# Patient Record
Sex: Female | Born: 1940 | Race: White | Hispanic: No | Smoking: Never smoker
Health system: Southern US, Community
[De-identification: ages and names within clinical notes are randomized; demographics above are authoritative.]

## PROBLEM LIST (undated history)

## (undated) DIAGNOSIS — H02889 Meibomian gland dysfunction of unspecified eye, unspecified eyelid: Secondary | ICD-10-CM

## (undated) DIAGNOSIS — N8181 Perineocele: Secondary | ICD-10-CM

## (undated) DIAGNOSIS — N816 Rectocele: Secondary | ICD-10-CM

## (undated) DIAGNOSIS — M1990 Unspecified osteoarthritis, unspecified site: Secondary | ICD-10-CM

## (undated) DIAGNOSIS — M810 Age-related osteoporosis without current pathological fracture: Secondary | ICD-10-CM

## (undated) DIAGNOSIS — N8110 Cystocele, unspecified: Secondary | ICD-10-CM

## (undated) HISTORY — DX: Rectocele: N81.6

## (undated) HISTORY — PX: COLONOSCOPY: SHX174

## (undated) HISTORY — DX: Cystocele, unspecified: N81.10

## (undated) HISTORY — DX: Meibomian gland dysfunction of unspecified eye, unspecified eyelid: H02.889

## (undated) HISTORY — DX: Perineocele: N81.81

## (undated) HISTORY — DX: Unspecified osteoarthritis, unspecified site: M19.90

## (undated) HISTORY — PX: ABDOMINAL HYSTERECTOMY: SHX81

## (undated) HISTORY — DX: Age-related osteoporosis without current pathological fracture: M81.0

---

## 1983-11-05 HISTORY — PX: VAGINAL HYSTERECTOMY: SUR661

## 2012-11-04 HISTORY — PX: POSTERIOR REPAIR: SHX2254

## 2013-11-04 DIAGNOSIS — N895 Stricture and atresia of vagina: Secondary | ICD-10-CM

## 2013-11-04 HISTORY — DX: Stricture and atresia of vagina: N89.5

## 2013-11-04 HISTORY — PX: OTHER SURGICAL HISTORY: SHX169

## 2019-11-05 HISTORY — PX: ESOPHAGOGASTRODUODENOSCOPY: SHX1529

## 2020-10-19 ENCOUNTER — Telehealth: Payer: Self-pay | Admitting: Internal Medicine

## 2020-10-19 NOTE — Telephone Encounter (Signed)
Hey Dr Leone Payor, this pt is being referred to [place] per the pt, seeking a new GI, for abdominal pain, pt last saw [facility] on 09/2020, I will send the records upstairs for you to review, please advise on scheduling.

## 2020-10-23 NOTE — Telephone Encounter (Signed)
Left msg on pt's vm to return call and schedule an appt with an APP

## 2020-10-23 NOTE — Telephone Encounter (Signed)
I have reviewed the chart and agree to see her.  Explain to her that it may be more efficient for her to come in and see one of our advanced practice providers and be assigned to me and I will review the note otherwise I think it may be February before she will be able to see me.

## 2020-11-15 ENCOUNTER — Encounter: Payer: Self-pay | Admitting: Physician Assistant

## 2020-11-15 ENCOUNTER — Ambulatory Visit (INDEPENDENT_AMBULATORY_CARE_PROVIDER_SITE_OTHER): Payer: Medicare PPO | Admitting: Physician Assistant

## 2020-11-15 ENCOUNTER — Other Ambulatory Visit (INDEPENDENT_AMBULATORY_CARE_PROVIDER_SITE_OTHER): Payer: Medicare PPO

## 2020-11-15 VITALS — BP 126/80 | HR 76 | Ht 66.0 in | Wt 124.0 lb

## 2020-11-15 DIAGNOSIS — R197 Diarrhea, unspecified: Secondary | ICD-10-CM

## 2020-11-15 DIAGNOSIS — R634 Abnormal weight loss: Secondary | ICD-10-CM

## 2020-11-15 DIAGNOSIS — R935 Abnormal findings on diagnostic imaging of other abdominal regions, including retroperitoneum: Secondary | ICD-10-CM | POA: Diagnosis not present

## 2020-11-15 LAB — COMPREHENSIVE METABOLIC PANEL
ALT: 18 U/L (ref 0–35)
AST: 35 U/L (ref 0–37)
Albumin: 4.6 g/dL (ref 3.5–5.2)
Alkaline Phosphatase: 61 U/L (ref 39–117)
BUN: 10 mg/dL (ref 6–23)
CO2: 30 mEq/L (ref 19–32)
Calcium: 9.5 mg/dL (ref 8.4–10.5)
Chloride: 99 mEq/L (ref 96–112)
Creatinine, Ser: 0.72 mg/dL (ref 0.40–1.20)
GFR: 79.74 mL/min (ref >60.00)
Glucose, Bld: 102 mg/dL — ABNORMAL HIGH (ref 70–99)
Potassium: 4.1 mEq/L (ref 3.5–5.1)
Sodium: 135 mEq/L (ref 135–145)
Total Bilirubin: 0.6 mg/dL (ref 0.2–1.2)
Total Protein: 7.1 g/dL (ref 6.0–8.3)

## 2020-11-15 LAB — CBC WITH DIFFERENTIAL/PLATELET
Basophils Absolute: 0.1 10*3/uL (ref 0.0–0.1)
Basophils Relative: 1.2 % (ref 0.0–3.0)
Eosinophils Absolute: 0.1 10*3/uL (ref 0.0–0.7)
Eosinophils Relative: 1.6 % (ref 0.0–5.0)
HCT: 40 % (ref 36.0–46.0)
Hemoglobin: 13.7 g/dL (ref 12.0–15.0)
Lymphocytes Relative: 26.9 % (ref 12.0–46.0)
Lymphs Abs: 2.1 10*3/uL (ref 0.7–4.0)
MCHC: 34.3 g/dL (ref 30.0–36.0)
MCV: 95.6 fl (ref 78.0–100.0)
Monocytes Absolute: 0.7 10*3/uL (ref 0.1–1.0)
Monocytes Relative: 9.3 % (ref 3.0–12.0)
Neutro Abs: 4.7 10*3/uL (ref 1.4–7.7)
Neutrophils Relative %: 61 % (ref 43.0–77.0)
Platelets: 371 10*3/uL (ref 150.0–400.0)
RBC: 4.19 Mil/uL (ref 3.87–5.11)
RDW: 12.6 % (ref 11.5–15.5)
WBC: 7.7 10*3/uL (ref 4.0–10.5)

## 2020-11-15 NOTE — Progress Notes (Addendum)
Chief Complaint: Urgent diarrhea, weight loss  HPI:    Tina Sparks is a 80 year old Caucasian female with a past medical history as listed below, who presents to clinic today for second opinion of her urgent diarrhea and weight loss.    06/14/2020 CT of the abdomen pelvis with contrast which showed dilation of the common bile duct and pancreatic duct.  Common bile duct was dilated within the head of the pancreas measuring 9.2 mm and the pancreatic duct dilated in the head of the pancreas measuring 4 mm.  Gallbladder was relatively unremarkable.  Also a finding of concern in the bowel with thickening involving the cecum.  Likely left ovarian cyst.    07/12/2020 colonoscopy report shows a few nonbleeding diverticula with a small openings in the left side of the colon, of mild severity and otherwise normal.    08/03/2020 patient saw PCP and at that time discussed abdominal cramping, weight loss and loose stools for several months.    08/10/2020 pelvic ultrasound for left ovarian cyst.  Patient was status post hysterectomy, right ovary was not visualized status post removal and left ovary measured 2.5 x 1.3 x 1.6 cm, there was a cyst.    09/13/2020 EGD at [facility] with "normal esophagus, normal stomach and normal duodenum"    10/11/20 patient seen by PCP and at that time they discussed abdominal pain.  It was noted she had a CT scan which showed abnormalities of the common duct as above.  Also thickening of the second portion of the duodenum.    Today, the patient presents to clinic and tells me that about 6 months ago she started with terrible abdominal cramping and urgency with loose watery stools that looked like "black vomit", these were daily, 2-3 times a day.  She initially went to see a gastroenterologist as above and had colonoscopy as well as stool studies which she tells me she were told were all normal.  They then did a CT scan and EGD and again everything was "normal".  Patient  tells me she has started to do her own research and feels like she may have celiac disease.  She has tried to alter her diet by decreasing caffeine and a few other things and her bowel habits now will be still very urgent and mostly liquid but have a little bit of solid stool and only occur 3-4 times a week at this point.  She is very frustrated because she wishes she knew what was going on so she could fix it.  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  Associated symptoms include a weight loss of about 15 to 20 pounds over the past 6 months.    Does admit to being very stressed and anxious over the entire situation.    Denies fever, chills, nausea, vomiting, heartburn, reflux, blood in her stool or symptoms that awaken her from sleep.  Past Medical History:  Diagnosis Date  . Arthritis     Current Outpatient Medications  Medication Sig Dispense Refill  . acetaminophen (TYLENOL) 650 MG CR tablet Take 1 tablet by mouth at bedtime.    . Biotin 1000 MCG tablet Take 1 tablet by mouth daily.    . Multiple Vitamin (MULTI-VITAMIN) tablet Take 1 tablet by mouth daily.     No current facility-administered medications for this visit.    Allergies as of 11/15/2020  . (  No Known Allergies)    Family History  Problem Relation Age of Onset  . Stroke Mother   . Heart disease Father   . Stroke Sister   . Liver disease Brother     Social History   Socioeconomic History  . Marital status: Single    Spouse name: Not on file  . Number of children: Not on file  . Years of education: Not on file  . Highest education level: Not on file  Occupational History  . Occupation: Retired  Tobacco Use  . Smoking status: Never Smoker  . Smokeless tobacco: Never Used  Substance and Sexual Activity  . Alcohol use: Not Currently  . Drug use: Never  . Sexual activity: Not Currently  Other  Topics Concern  . Not on file  Social History Narrative  . Not on file   Social Determinants of Health   Financial Resource Strain: Not on file  Food Insecurity: Not on file  Transportation Needs: Not on file  Physical Activity: Not on file  Stress: Not on file  Social Connections: Not on file  Intimate Partner Violence: Not on file    Review of Systems:    Constitutional: No fever or chills Skin: No rash Cardiovascular: No chest pain Respiratory: No SOB Gastrointestinal: See HPI and otherwise negative Genitourinary: No dysuria Neurological: No headache, dizziness or syncope Musculoskeletal: No new muscle or joint pain Hematologic: No bleeding  Psychiatric: No history of depression or anxiety   Physical Exam:  Vital signs: BP 126/80   Pulse 76   Ht 5\' 6"  (1.676 m)   Wt 124 lb (56.2 kg)   BMI 20.01 kg/m   Constitutional:   Pleasant Elderly Caucasian female appears to be in NAD, Well developed, Well nourished, alert and cooperative Head:  Normocephalic and atraumatic. Eyes:   PEERL, EOMI. No icterus. Conjunctiva pink. Ears:  Normal auditory acuity. Neck:  Supple Throat: Oral cavity and pharynx without inflammation, swelling or lesion.  Respiratory: Respirations even and unlabored. Lungs clear to auscultation bilaterally.   No wheezes, crackles, or rhonchi.  Cardiovascular: Normal S1, S2. No MRG. Regular rate and rhythm. No peripheral edema, cyanosis or pallor.  Gastrointestinal:  Soft, nondistended, nontender. No rebound or guarding. Normal bowel sounds. No appreciable masses or hepatomegaly. Rectal:  Not performed.  Msk:  Symmetrical without gross deformities. Without edema, no deformity or joint abnormality.  Neurologic:  Alert and  oriented x4;  grossly normal neurologically.  Skin:   Dry and intact without significant lesions or rashes. Psychiatric: Demonstrates good judgement and reason without abnormal affect or behaviors.  See HPI  Assessment: 1.  Urgent  diarrhea: Over the past 6 months has gotten slightly better over the past month or so, initially with weight loss of about 15 pounds in the past 6 months, previous work-up with EGD, colonoscopy, CT and apparently stool studies all negative/normal (we are requesting these today); consider celiac disease versus more likely IBS versus other 2.  Abnormal CT of the abdomen: Showing dilated bile ducts 3.  Weight loss: 15 pounds in the past 6 months  Plan: 1. Ordered celiac testing, CBC and CMP 2.  Discussed patient's history with her in depth.  She is very frustrated, apparently had no knowledge of her abnormal CT. 3.  We only received partial records today, will contact her previous GI physician as well as PCP for recent labs and pathology results as well as office visits. 4.  Ordered an MRCP with and without contrast today for  further evaluation of dilated bile duct seen at time of CT. 5.  Told the patient to continue what dieting she is doing currently as this seems to be helping slightly. 6.  Briefly discussed the possibility of IBS given patient's stress. 7.  Patient to follow in clinic per recommendations after testing above.  She wanted to be assigned to Dr. Leone Payor today.  Hyacinth Meeker, PA-C [facility] 11/15/2020, 3:42 PM   Addendum: 11/16/2020 12:44 PM  Received records from [facility]  07/12/2020 colonoscopy with mild diverticulosis in the left colon and otherwise normal.  (It does not indicate that biopsies were taken.  09/13/2020 EGD with normal esophagus, normal stomach and normal duodenum.  (It does not indicate the biopsies were taken)  Reviewed records above, will request actual office visit notes from them.  Interestingly neither procedure report indicates that they did biopsies, so I do not believe they did testing for celiac or microscopic colitis.  Hyacinth Meeker, PA-C  Addendum: 11/29/2020 9:12 AM  Received records from [facility]  [facility].  10/11/2020 patient seen and continued with chronic and unstable abdominal pain.  There is some concern given that CT scan showed abnormalities of the common duct and pancreatic duct with dilation.  There is also thickening of the second portion of the duodenum extending to the ampulla.  She was referred to [place].  No change to current recommendations.  Hyacinth Meeker, PA-C  Addendum: 11/29/2020 11:18 AM  We will see records from [facility] gastroenterology center in regards to office visits.  10/14/2019 patient was having a 51-month follow-up for diarrhea.  She thought it was some stress at that time.  She was told to get a colonoscopy.  06/06/2020 patient seen in clinic for suspicion of IBS.  She described diarrhea several times a day and more gas pain than normal.  She was told to have a colonoscopy and also had stool studies.  06/13/2020 stool studies showed negative C. difficile, Campylobacter, E. coli, Salmonella, ova and parasites.  06/22/2020 patient seen in clinic for follow-up of diarrhea.  At that time she had a colon scheduled for 07/12/2020.  She was placed on dicyclomine 10 mg twice a day and a high-fiber diet.  09/08/2020 patient was seen for follow-up.  At that time described having diarrhea and weight loss.  At that time apparently her main symptom is foul-smelling flatus at times with urgency of defecation, described losing 15 pounds in the last few months.  Is recommend she have an EGD due to abnormality around the duodenum and a CT.  No change to current recommendations.  Hyacinth Meeker, PA-C

## 2020-11-15 NOTE — Patient Instructions (Signed)
If you are age 80 or older, your body mass index should be between 23-30. Your Body mass index is 20.01 kg/m. If this is out of the aforementioned range listed, please consider follow up with your Primary Care Provider.  If you are age 35 or younger, your body mass index should be between 19-25. Your Body mass index is 20.01 kg/m. If this is out of the aformentioned range listed, please consider follow up with your Primary Care Provider.   Your provider has requested that you go to the basement level for lab work before leaving today. Press "B" on the elevator. The lab is located at the first door on the left as you exit the elevator.  You have been scheduled for an MRI at [facility] Radiology 1st floor  on 11/27/20. Your appointment time is 9am. Please arrive at 8:30am  for registration purposes. Please make certain not to have anything to eat or drink 6 hours prior to your test. In addition, if you have any metal in your body, have a pacemaker or defibrillator, please be sure to let your ordering physician know. This test typically takes 45 minutes to 1 hour to complete. Should you need to reschedule, please call [phone] to do so.  Thank you for choosing me and [facility].  Hyacinth Meeker, PA-C

## 2020-11-16 LAB — IGA: Immunoglobulin A: 76 mg/dL (ref 70–320)

## 2020-11-16 LAB — TISSUE TRANSGLUTAMINASE, IGA: (tTG) Ab, IgA: <1 U/mL

## 2020-11-22 ENCOUNTER — Telehealth: Payer: Self-pay | Admitting: Physician Assistant

## 2020-11-22 NOTE — Telephone Encounter (Signed)
Pt needs to r/s MRI that is scheduled for 11/27/20. She will have more availability around mid February.

## 2020-11-23 NOTE — Telephone Encounter (Signed)
Patient is returning your call.  

## 2020-11-23 NOTE — Telephone Encounter (Signed)
Left detailed message letting patient know that she can call central scheduling to reschedule her MRI at her convenience, I provided her with the number. Advised patient to give me a call back if she had any further questions or concerns.

## 2020-11-23 NOTE — Telephone Encounter (Signed)
Lm on vm for patient to return call 

## 2020-11-27 ENCOUNTER — Ambulatory Visit (HOSPITAL_COMMUNITY): Payer: Medicare PPO

## 2020-12-20 ENCOUNTER — Ambulatory Visit (HOSPITAL_COMMUNITY): Payer: Medicare PPO

## 2020-12-26 ENCOUNTER — Ambulatory Visit (HOSPITAL_COMMUNITY): Payer: Medicare PPO

## 2021-01-02 ENCOUNTER — Other Ambulatory Visit: Payer: Self-pay | Admitting: Physician Assistant

## 2021-01-02 ENCOUNTER — Ambulatory Visit (HOSPITAL_COMMUNITY)
Admission: RE | Admit: 2021-01-02 | Discharge: 2021-01-02 | Disposition: A | Discharge status: Home / Self Care | Payer: Medicare PPO | Source: Ambulatory Visit | Attending: Physician Assistant | Admitting: Physician Assistant

## 2021-01-02 ENCOUNTER — Other Ambulatory Visit: Payer: Self-pay

## 2021-01-02 DIAGNOSIS — R935 Abnormal findings on diagnostic imaging of other abdominal regions, including retroperitoneum: Secondary | ICD-10-CM

## 2021-01-02 DIAGNOSIS — R634 Abnormal weight loss: Secondary | ICD-10-CM

## 2021-01-02 DIAGNOSIS — R197 Diarrhea, unspecified: Secondary | ICD-10-CM

## 2021-01-02 MED ORDER — GADOBUTROL 1 MMOL/ML IV SOLN
5.0000 mL | Freq: Once | INTRAVENOUS | Status: AC | PRN
  Administered 2021-01-02: 5 mL via INTRAVENOUS

## 2021-01-04 ENCOUNTER — Other Ambulatory Visit: Payer: Self-pay

## 2021-01-04 ENCOUNTER — Telehealth: Payer: Self-pay | Admitting: Physician Assistant

## 2021-01-04 DIAGNOSIS — R935 Abnormal findings on diagnostic imaging of other abdominal regions, including retroperitoneum: Secondary | ICD-10-CM

## 2021-01-04 DIAGNOSIS — R197 Diarrhea, unspecified: Secondary | ICD-10-CM

## 2021-01-04 DIAGNOSIS — R634 Abnormal weight loss: Secondary | ICD-10-CM

## 2021-01-04 NOTE — Telephone Encounter (Signed)
Unk Lightning, PA  [place], [place], RN Yes, please tell her that I do have documentation of those other stool studies, this is a new 1. This is checking for pancreatic insufficiency in regards to new imaging which we have seen. Thanks.   Patient has been advised of this information, she would like to have stool study completed at a lab closer to her home. Patient is getting the lab information so that the order can be faxed. See telephone encounter 01/04/21 for more information.

## 2021-01-04 NOTE — Telephone Encounter (Signed)
Patient called requesting to speak with a nurse has additional information to provide from previous result note

## 2021-01-04 NOTE — Telephone Encounter (Signed)
Spoke with patient, she states that Dr. Eudelia Bunch office stated that they would fax records to [place] today. Patient would like for me to let her know by 4:15 PM if I have not received the records.

## 2021-01-08 NOTE — Telephone Encounter (Signed)
Spoke with patient she states that she will plan to leave stool sample on 01/30/21 when she will be in town, patient states that if she is able to come in sooner she will let us know. She is aware that she can stop by between 7:30 am - 5 pm. Patient verbalized understanding and had no concerns at the end of the call.

## 2021-01-08 NOTE — Telephone Encounter (Signed)
Pt is requesting a call back from a nurse to schedule her stool study sample.

## 2021-01-08 NOTE — Addendum Note (Signed)
Addended by: Missy Sabins on: 01/08/2021 02:53 PM   Modules accepted: Orders

## 2021-01-16 NOTE — Addendum Note (Signed)
Addended by: Missy Sabins on: 01/16/2021 02:36 PM   Modules accepted: Orders

## 2021-02-05 DIAGNOSIS — R152 Fecal urgency: Secondary | ICD-10-CM

## 2021-02-05 DIAGNOSIS — K838 Other specified diseases of biliary tract: Secondary | ICD-10-CM

## 2021-02-05 DIAGNOSIS — Q453 Other congenital malformations of pancreas and pancreatic duct: Secondary | ICD-10-CM

## 2021-02-05 DIAGNOSIS — R634 Abnormal weight loss: Secondary | ICD-10-CM

## 2021-02-06 ENCOUNTER — Other Ambulatory Visit: Payer: Self-pay | Admitting: Internal Medicine

## 2021-02-09 ENCOUNTER — Encounter: Payer: Self-pay | Admitting: Internal Medicine

## 2021-02-09 DIAGNOSIS — R634 Abnormal weight loss: Secondary | ICD-10-CM | POA: Insufficient documentation

## 2021-02-09 DIAGNOSIS — Q453 Other congenital malformations of pancreas and pancreatic duct: Secondary | ICD-10-CM | POA: Insufficient documentation

## 2021-02-09 DIAGNOSIS — R152 Fecal urgency: Secondary | ICD-10-CM | POA: Insufficient documentation

## 2021-02-09 DIAGNOSIS — K838 Other specified diseases of biliary tract: Secondary | ICD-10-CM | POA: Insufficient documentation

## 2021-02-09 LAB — PANCREATIC ELASTASE, FECAL: Pancreatic Elastase, Fecal: 221 ug Elast./g (ref >200)

## 2021-02-09 NOTE — Telephone Encounter (Signed)
I called the patient.  Her fecal elastase result was faxed to me and was 221 which is above normal of 200.  Her bowel movements are less loose though they still occur that way sometimes.  Weight loss has stabilized.  She has fecal urgency issues.  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  She could not remember that actually.   She has a 10 mm bile duct with intact gallbladder, and a mildly possibly dilated pancreatic duct and a questionable calcification in the head of the pancreas.  I think she is going to need additional imaging with EUS plus or minus ERCP.   She has plans to try Benefiber to see if that helps her fecal urgency.  We will see her next week I have some openings and I had like to examine her in person and come up with the next steps.  I will ask my colleagues to do EUS to review her imaging to see what they think is the next best step though I think EUS makes sense rather than moving to ERCP with only a dilated bile duct and unclear pancreatic abnormalities and normal LFTs.

## 2021-02-12 NOTE — Telephone Encounter (Signed)
Spoke with patient, she has been scheduled for a follow up with Dr. Leone Payor on Wed. 02/14/21 at 2:50 PM. Patient wanted to thank Dr. Leone Payor again for reaching out to her. She had no further concerns at the end of the call.

## 2021-02-14 ENCOUNTER — Ambulatory Visit: Payer: Medicare PPO | Admitting: Internal Medicine

## 2021-02-14 ENCOUNTER — Encounter: Payer: Self-pay | Admitting: Internal Medicine

## 2021-02-14 VITALS — BP 102/64 | HR 64 | Ht 66.0 in | Wt 122.0 lb

## 2021-02-14 DIAGNOSIS — K838 Other specified diseases of biliary tract: Secondary | ICD-10-CM

## 2021-02-14 DIAGNOSIS — Q453 Other congenital malformations of pancreas and pancreatic duct: Secondary | ICD-10-CM

## 2021-02-14 DIAGNOSIS — R152 Fecal urgency: Secondary | ICD-10-CM

## 2021-02-14 NOTE — Progress Notes (Signed)
Tina Sparks 80 y.o. 1941-02-11 [id number]  Assessment & Plan:   Encounter Diagnoses  Name Primary?  . Fecal urgency Yes  . Dilated bile duct   . 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  She had a lot of diarrhea than that improved but she continues with some symptoms.  She can continue her trial of fiber.  I had wondered about the possibility of pelvic floor dysfunction given her previous pelvic surgeries but at least on physical exam today and by history that does not seem to be the case.  I am not sure exactly how to proceed yet regarding the dilated bile duct the abnormalities of the pancreatic duct and the question calcification in the pancreas.  We will obtain the images of her CT scan in [place] from last year so that our radiologist may compare.  Pending that she could need endoscopic evaluation with EUS plus or minus ERCP.  Considering possible repeat colonoscopy pending clinical course with bowel symptoms.  The dicyclomine was reasonable but is not clear is helping her so I told her she can stop it and/or use as needed.  I appreciate the opportunity to care for this patient. CC: Aniceto Boss, MD    Subjective:   Chief Complaint: Fecal urgency, diarrhea weight loss  HPI The patient is a 80 year old woman seen by Tina Meeker, PA-C on November 15, 2020 because of an abnormal CT of the abdomen diarrhea issues etc.  Please refer to that note for full details.  As part of that work-up she had an MRCP MRI and also had fecal elastase which returned normal.   IMPRESSION: 1. Fusiform dilatation of the common bile duct measuring up to 1 cm without evidence for choledocholithiasis. 2. There is mild increase caliber of the main pancreatic duct within the head of pancreas which measures between 3 and 4 mm. Focal area of signal void artifact noted within the head of pancreas in the region of the distal main duct  measuring 0.9 cm, image 47/13. This may represent a calcification. Comparison with previous outside CT would be helpful for further delineation of the etiology for signal void artifact. Additionally, if there are signs or symptoms of biliary obstruction further investigation with ERCP would be recommended to exclude underlying lesion at the ampulla or occult lesion within head of pancreas.  Wt Readings from Last 3 Encounters:  02/14/21 122 lb (55.3 kg)  11/15/20 124 lb (56.2 kg)   She has problems with fecal urgency more than anything now for example she will have a normal bowel movement which is unusual and then she will have to go back and defecate again it will be diarrhea.  But a lot of times will also have soft stools and had this urgency.  No significant incontinence and no urinary incontinence.  A number of years ago she had a posterior repair of cystocele and rectocele and perineocele at [facility].  Those problems are all resolved though she did need a surgery because of vaginal stenosis after that.  She has recently started a trial of Benefiber.  She is on dicyclomine and is not sure that is making a difference. No Known Allergies Current Meds  Medication Sig  . acetaminophen (TYLENOL) 650 MG CR tablet Take 1 tablet by mouth at bedtime.  . Biotin 1000 MCG tablet Take 1 tablet by mouth daily.  Marland Kitchen dicyclomine (BENTYL) 10 MG capsule Take 10 mg  by mouth 2 (two) times daily.  . Multiple Vitamin (MULTI-VITAMIN) tablet Take 1 tablet by mouth daily.   Past Medical History:  Diagnosis Date  . Cystocele with rectocele   . Meibomian gland dysfunction   . Osteoarthritis   . Osteoporosis   . Perineocele   . 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 COLONOSCOPY    . ESOPHAGOGASTRODUODENOSCOPY  2021  . POSTERIOR REPAIR  2014   With sacrouterine fixation  . VAGINAL HYSTERECTOMY  1985  . Vaginal scar revision   2015   Scarring from prior prolapse surgery   Social History   Social History Narrative   Retired Runner, broadcasting/film/video   Single   Never smoker no current alcohol tobacco or drug use   family history includes Heart disease in her father; Liver disease in her brother; Stroke in her mother and sister.   Review of Systems   Objective:   Physical Exam BP 102/64   Pulse 64   Ht 5\' 6"  (1.676 m)   Wt 122 lb (55.3 kg)   SpO2 99%   BMI 19.69 kg/m  Thin NAD abd soft and NT  Tina Sparks CMA present   Anoderm inspection revealed no abnormalities Digital exam revealed fairly normal resting tone and voluntary squeeze.  She did use gluteals quite a bit to squeeze I think the anal sphincter on voluntary effort is somewhat weak. No mass or rectocele present. Simulated defecation with valsalva revealed appropriate abdominal contraction and descent.   Anoscopy is performed and demonstrates no significant hemorrhoids or anal or rectal mucosal change.

## 2021-02-14 NOTE — Patient Instructions (Signed)
Please get all the actual CT and ultrasound disc from 2021 to Dr Leone Payor either by mail or bring them to [place].  Once Dr Leone Payor reviews them we will be in touch with plans.  You may take the dicyclomine as needed.  I appreciate the opportunity to care for you. Stan Head, MD, [facility]

## 2021-04-03 ENCOUNTER — Inpatient Hospital Stay
Admission: RE | Admit: 2021-04-03 | Discharge: 2021-04-03 | Disposition: A | Discharge status: Home / Self Care | Payer: Self-pay | Source: Ambulatory Visit | Attending: Internal Medicine | Admitting: Internal Medicine

## 2021-04-03 ENCOUNTER — Other Ambulatory Visit (HOSPITAL_COMMUNITY): Payer: Self-pay | Admitting: Internal Medicine

## 2021-04-03 DIAGNOSIS — K838 Other specified diseases of biliary tract: Secondary | ICD-10-CM

## 2021-04-12 ENCOUNTER — Other Ambulatory Visit: Payer: Self-pay

## 2021-04-12 DIAGNOSIS — K838 Other specified diseases of biliary tract: Secondary | ICD-10-CM

## 2021-04-24 ENCOUNTER — Telehealth: Payer: Self-pay

## 2021-04-24 DIAGNOSIS — R152 Fecal urgency: Secondary | ICD-10-CM

## 2021-04-24 NOTE — Telephone Encounter (Signed)
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

## 2021-04-24 NOTE — Telephone Encounter (Signed)
Referral placed and they will contact her about an appointment.

## 2021-05-01 NOTE — Telephone Encounter (Signed)
See alternate patient message. 

## 2021-05-02 ENCOUNTER — Telehealth: Payer: Self-pay | Admitting: Physician Assistant

## 2021-05-02 NOTE — Telephone Encounter (Signed)
Telephone call  05/02/2021 2:30 PM  Patient sent a MyChart message requesting a phone call to answer some questions about upcoming procedure as well as physical therapy suggested by Dr. Leone Payor.  He tried to call her earlier this afternoon and I have now called 3 times with no response.  I did leave a message on her machine that we are trying to get in contact with her.  Explained that I will try and call back 1 more time later today and/or tomorrow morning.  Hyacinth Meeker, PA-C

## 2021-05-03 ENCOUNTER — Telehealth: Payer: Self-pay | Admitting: Physician Assistant

## 2021-05-03 NOTE — Progress Notes (Signed)
Attempted to obtain medical history via telephone, unable to reach at this time. I left a voicemail to return pre surgical testing department's phone call.  

## 2021-05-03 NOTE — Telephone Encounter (Signed)
Tina Sparks, patient is scheduled for an EUS with Dr. Christella Hartigan on Thursday, 05/10/21 at 11:30 am at [facility]. This information is under the "Admissions" tab on the appt desk. Thanks

## 2021-05-03 NOTE — Telephone Encounter (Signed)
Telephone Call  05/03/21  9:03  Spoke with patient this morning.  Answered questions about upcoming procedure scheduled with Dr. Meridee Score.  She tells me is scheduled on the 7th of July though I cannot see any appointments in the computer for her.  I am going to double check with our nurse to make sure this is arranged.  Discussed that this is due to dilated common bile duct seen on recent MRI.  Answered all of her questions.  Also discussed rectal/fecal incontinence.  Discussed physical floor therapy and why this was recommended.  Patient tells me she is going to follow through.  She appreciated our phone call.  Tina Meeker, PA-C     [place]- can you make sure patient is scheduled for EUS with Dr. Meridee Score.  Thanks-JLL

## 2021-05-08 NOTE — Progress Notes (Signed)
Attempted to obtain medical history via telephone, unable to reach at this time. I left a voicemail to return pre surgical testing department's phone call.  

## 2021-05-10 ENCOUNTER — Ambulatory Visit (HOSPITAL_COMMUNITY)
Admission: RE | Admit: 2021-05-10 | Discharge: 2021-05-10 | Disposition: A | Discharge status: Home / Self Care | Payer: Medicare PPO | Attending: Gastroenterology | Admitting: Gastroenterology

## 2021-05-10 ENCOUNTER — Encounter (HOSPITAL_COMMUNITY): Payer: Self-pay | Admitting: Gastroenterology

## 2021-05-10 ENCOUNTER — Encounter (HOSPITAL_COMMUNITY)
Admission: RE | Disposition: A | Discharge status: Home / Self Care | Payer: Self-pay | Source: Home / Self Care | Attending: Gastroenterology

## 2021-05-10 ENCOUNTER — Ambulatory Visit (HOSPITAL_COMMUNITY): Payer: Medicare PPO | Admitting: Registered Nurse

## 2021-05-10 ENCOUNTER — Other Ambulatory Visit: Payer: Self-pay

## 2021-05-10 DIAGNOSIS — K838 Other specified diseases of biliary tract: Secondary | ICD-10-CM | POA: Diagnosis present

## 2021-05-10 DIAGNOSIS — K5710 Diverticulosis of small intestine without perforation or abscess without bleeding: Secondary | ICD-10-CM | POA: Diagnosis not present

## 2021-05-10 HISTORY — PX: EUS: SHX5427

## 2021-05-10 HISTORY — PX: ESOPHAGOGASTRODUODENOSCOPY (EGD) WITH PROPOFOL: SHX5813

## 2021-05-10 SURGERY — UPPER ENDOSCOPIC ULTRASOUND (EUS) RADIAL
Anesthesia: Monitor Anesthesia Care

## 2021-05-10 MED ORDER — LACTATED RINGERS IV SOLN: INTRAVENOUS | Status: DC | PRN

## 2021-05-10 MED ORDER — SODIUM CHLORIDE 0.9 % IV SOLN: INTRAVENOUS | Status: DC

## 2021-05-10 MED ORDER — PROPOFOL 500 MG/50ML IV EMUL
INTRAVENOUS | Status: DC | PRN
  Administered 2021-05-10 (×2): 450 ug/kg/min via INTRAVENOUS

## 2021-05-10 MED ORDER — PROPOFOL 10 MG/ML IV BOLUS
INTRAVENOUS | Status: DC | PRN
  Administered 2021-05-10: 100 mg via INTRAVENOUS
  Administered 2021-05-10: 50 mg via INTRAVENOUS
  Administered 2021-05-10: 150 mg via INTRAVENOUS

## 2021-05-10 MED ORDER — PROPOFOL 1000 MG/100ML IV EMUL
INTRAVENOUS | Status: AC
  Filled 2021-05-10: qty 100

## 2021-05-10 MED ORDER — ONDANSETRON HCL 4 MG/2ML IJ SOLN
INTRAMUSCULAR | Status: DC | PRN
  Administered 2021-05-10: 4 mg via INTRAVENOUS

## 2021-05-10 MED ORDER — LACTATED RINGERS IV SOLN: Freq: Once | INTRAVENOUS | Status: AC

## 2021-05-10 NOTE — Anesthesia Preprocedure Evaluation (Addendum)
Anesthesia Evaluation  Patient identified by MRN, date of birth, ID band Patient awake    Reviewed: Allergy & Precautions, NPO status , Patient's Chart, lab work & pertinent test results  History of Anesthesia Complications Negative for: history of anesthetic complications  Airway Mallampati: II  TM Distance: >3 FB Neck ROM: Full    Dental no notable dental hx.    Pulmonary neg pulmonary ROS,    Pulmonary exam normal        Cardiovascular negative cardio ROS Normal cardiovascular exam     Neuro/Psych negative neurological ROS  negative psych ROS   GI/Hepatic Neg liver ROS, Dilated bile duct   Endo/Other  negative endocrine ROS  Renal/GU negative Renal ROS  negative genitourinary   Musculoskeletal  (+) Arthritis ,   Abdominal   Peds  Hematology negative hematology ROS (+)   Anesthesia Other Findings Day of surgery medications reviewed with patient.  Reproductive/Obstetrics negative OB ROS                            Anesthesia Physical Anesthesia Plan  ASA: 2  Anesthesia Plan: MAC   Post-op Pain Management:    Induction:   PONV Risk Score and Plan: Treatment may vary due to age or medical condition and Propofol infusion  Airway Management Planned: Natural Airway and Nasal Cannula  Additional Equipment: None  Intra-op Plan:   Post-operative Plan:   Informed Consent: I have reviewed the patients History and Physical, chart, labs and discussed the procedure including the risks, benefits and alternatives for the proposed anesthesia with the patient or authorized representative who has indicated his/her understanding and acceptance.       Plan Discussed with: CRNA  Anesthesia Plan Comments:        Anesthesia Quick Evaluation

## 2021-05-10 NOTE — H&P (Signed)
  HPI: This is a woman with asymptomatic dilated bile duct and pancreatic duct   ROS: complete GI ROS as described in HPI, all other review negative.  Constitutional:  No unintentional weight loss   Past Medical History:  Diagnosis Date   Cystocele with rectocele    Meibomian gland dysfunction    Osteoarthritis    Osteoporosis    Perineocele    Vaginal stenosis 2015   After posterior repair corrected with surgery    Past Surgical History:  Procedure Laterality Date   COLONOSCOPY     ESOPHAGOGASTRODUODENOSCOPY  2021   POSTERIOR REPAIR  2014   With sacrouterine fixation   VAGINAL HYSTERECTOMY  1985   Vaginal scar revision  2015   Scarring from prior prolapse surgery    Current Facility-Administered Medications  Medication Dose Route Frequency Provider Last Rate Last Admin   0.9 %  sodium chloride infusion   Intravenous Continuous Rachael Fee, MD        Allergies as of 04/12/2021   (No Known Allergies)    Family History  Problem Relation Age of Onset   Stroke Mother    Heart disease Father    Stroke Sister    Liver disease Brother     Social History   Socioeconomic History   Marital status: Divorced    Spouse name: Not on file   Number of children: Not on file   Years of education: Not on file   Highest education level: Not on file  Occupational History   Occupation: Retired  Tobacco Use   Smoking status: Never   Smokeless tobacco: Never  Substance and Sexual Activity   Alcohol use: Not Currently   Drug use: Never   Sexual activity: Not Currently  Other Topics Concern   Not on file  Social History Narrative   Retired Runner, broadcasting/film/video   Single   Never smoker no current alcohol tobacco or drug use   Social Determinants of Corporate investment banker Strain: Not on file  Food Insecurity: Not on file  Transportation Needs: Not on file  Physical Activity: Not on file  Stress: Not on file  Social Connections: Not on file  Intimate Partner Violence:  Not on file     Physical Exam: There were no vitals taken for this visit. Constitutional: generally well-appearing Psychiatric: alert and oriented x3 Abdomen: soft, nontender, nondistended, no obvious ascites, no peritoneal signs, normal bowel sounds No peripheral edema noted in lower extremities  Assessment and plan: 80 y.o. female with dilated bile duct, dilated pancreatic duct.  Normal lfts, asymptomatic  For eus evaluation today  Please see the "Patient Instructions" section for addition details about the plan.  Rob Bunting, MD [facility] 05/10/2021, 10:21 AM

## 2021-05-10 NOTE — Op Note (Addendum)
[facility] Patient Name: Tina Sparks Procedure Date: 05/10/2021 MRN: [id number] Attending MD: Rachael Fee , MD Date of Birth: [birth year] CSN: [id number] Age: 80 Admit Type: Outpatient Procedure:                Upper EUS Indications:              Dilated CBD on recent MR, chronic loose stools, 15                            pound weight loss in 1 year; normal LFTs Providers:                Rachael Fee, MD, Rogue Jury, RN, [place], Lawson Radar, Technician, Anastasio Champion, CRNA Referring MD:              Medicines:                Monitored Anesthesia Care Complications:            No immediate complications. Estimated blood loss:                            None. Estimated Blood Loss:     Estimated blood loss: none. Procedure:                Pre-Anesthesia Assessment:                           - Prior to the procedure, a History and Physical                            was performed, and patient medications and                            allergies were reviewed. The patient's tolerance of                            previous anesthesia was also reviewed. The risks                            and benefits of the procedure and the sedation                            options and risks were discussed with the patient.                            All questions were answered, and informed consent                            was obtained. Prior Anticoagulants: The patient has                            taken no previous anticoagulant or antiplatelet  agents. ASA Grade Assessment: III - A patient with                            severe systemic disease. After reviewing the risks                            and benefits, the patient was deemed in                            satisfactory condition to undergo the procedure.                           After obtaining informed consent, the endoscope was                             passed under direct vision. Throughout the                            procedure, the patient's blood pressure, pulse, and                            oxygen saturations were monitored continuously. The                            [id number] ([id number]) Olympus was introduced through                            the mouth, and advanced to the second part of                            duodenum. The [id number]-[id number] ([id number]) Olympus                            Duodenoscope was introduced through the mouth, and                            advanced to the second part of duodenum. The upper                            EUS was accomplished without difficulty. The                            patient tolerated the procedure well. Scope In: Scope Out: Findings:      ENDOSCOPIC FINDING (with ERCP scope and radial EUS scope): :      The examined esophagus was endoscopically normal.      The entire examined stomach was endoscopically normal.      The major papilla was normal. 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      ENDOSONOGRAPHIC FINDING: :      1. CBD was slightly dilated (7.24mm) without stones or soft tissue       lesions. The duct tapered normally into the region of the major papilla.      2.  Pancreatic parenchyma was normal; no discrete masses and no signs of       chronic pancreatitis. Air artifact introduced by the duodenal       diverticulum slightly limited evaluation of the pancreatic head.      3. Main pancreatic duct was normal; not dilated.      4. No peripancreatic, periportal adenopathy.      5. Limited views of the gallbladder, liver, spleen, portal and splenic       vessels. 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 Could consider repeat                            colonoscopy (the  [place] colonoscopy 09/2020 was                            without colon biopsies) Moderate Sedation:      Not Applicable - Patient had care per Anesthesia. Recommendation:           - Discharge patient to home. Procedure Code(s):        --- Professional ---                           [id number], Esophagogastroduodenoscopy, flexible,                            transoral; with endoscopic ultrasound examination                            limited to the esophagus, stomach or duodenum, and                            adjacent structures Diagnosis Code(s):        --- Professional ---                           K83.8, Other specified diseases of biliary tract CPT copyright 2019 American Medical Association. All rights reserved. The codes documented in this report are preliminary and upon coder review may  be revised to meet current compliance requirements. Rachael Fee, MD 05/10/2021 11:34:30 AM This report has been signed electronically. Number of Addenda: 0

## 2021-05-10 NOTE — Anesthesia Postprocedure Evaluation (Signed)
Anesthesia Post Note  Patient: Tina Sparks  Procedure(s) Performed: UPPER ENDOSCOPIC ULTRASOUND (EUS) RADIAL ESOPHAGOGASTRODUODENOSCOPY (EGD) WITH PROPOFOL     Patient location during evaluation: PACU Anesthesia Type: MAC Level of consciousness: awake and alert and oriented Pain management: pain level controlled Vital Signs Assessment: post-procedure vital signs reviewed and stable Respiratory status: spontaneous breathing, nonlabored ventilation and respiratory function stable Cardiovascular status: blood pressure returned to baseline Postop Assessment: no apparent nausea or vomiting Anesthetic complications: no   No notable events documented.  Last Vitals:  Vitals:   05/10/21 1130 05/10/21 1140  BP: 106/68 136/78  Pulse: 61 62  Resp: 16 18  Temp:    SpO2: 100% 99%    Last Pain:  Vitals:   05/10/21 1140  TempSrc:   PainSc: 0-No pain                 Brennan Bailey

## 2021-05-10 NOTE — Anesthesia Procedure Notes (Signed)
Procedure Name: MAC Date/Time: 05/10/2021 2:06 PM Performed by: Lissa Morales, CRNA Pre-anesthesia Checklist: Patient identified, Emergency Drugs available, Suction available, Patient being monitored and Timeout performed Patient Re-evaluated:Patient Re-evaluated prior to induction Oxygen Delivery Method: Simple face mask Placement Confirmation: positive ETCO2

## 2021-05-10 NOTE — Transfer of Care (Signed)
Immediate Anesthesia Transfer of Care Note  Patient: Tina Sparks  Procedure(s) Performed: UPPER ENDOSCOPIC ULTRASOUND (EUS) RADIAL ESOPHAGOGASTRODUODENOSCOPY (EGD) WITH PROPOFOL  Patient Location: PACU  Anesthesia Type:MAC  Level of Consciousness: awake and sedated  Airway & Oxygen Therapy: Patient Spontanous Breathing and Patient connected to face mask oxygen  Post-op Assessment: Report given to RN and Post -op Vital signs reviewed and stable  Post vital signs: stable  Last Vitals:  Vitals Value Taken Time  BP 133/63 05/10/21 1120  Temp    Pulse 66 05/10/21 1120  Resp 18 05/10/21 1120  SpO2 100 % 05/10/21 1120    Last Pain:  Vitals:   05/10/21 1120  TempSrc:   PainSc: 0-No pain         Complications: No notable events documented.

## 2021-05-22 ENCOUNTER — Ambulatory Visit: Payer: Medicare PPO | Admitting: Physical Therapy

## 2021-06-05 ENCOUNTER — Encounter: Payer: Medicare PPO | Attending: Internal Medicine | Admitting: Physical Therapy

## 2021-06-05 ENCOUNTER — Other Ambulatory Visit: Payer: Self-pay

## 2021-06-05 ENCOUNTER — Encounter: Payer: Self-pay | Admitting: Physical Therapy

## 2021-06-05 ENCOUNTER — Ambulatory Visit: Payer: Medicare PPO | Admitting: Physical Therapy

## 2021-06-05 DIAGNOSIS — M6281 Muscle weakness (generalized): Secondary | ICD-10-CM

## 2021-06-05 DIAGNOSIS — R159 Full incontinence of feces: Secondary | ICD-10-CM | POA: Diagnosis present

## 2021-06-05 DIAGNOSIS — R278 Other lack of coordination: Secondary | ICD-10-CM

## 2021-06-05 NOTE — Patient Instructions (Signed)
Access Code: [email or web address] URL: [email or web address] Date: 06/05/2021 Prepared by: Eulis Foster  Exercises Sidelying Pelvic Floor Contraction with Self-Palpation - 3 x daily - 7 x weekly - 1 sets - 10 reps - 5 sec hold Seated Pelvic Floor Contraction - 3 x daily - 7 x weekly - 1 sets - 5 reps - 5 sec hold Eulis Foster, PT [facility] [address] [place], [place] [address] W: [phone] [email or web address]

## 2021-06-05 NOTE — Therapy (Addendum)
[facility] [address] [place], [place], [address] Phone: [phone]   Fax:  [phone]  Physical Therapy Evaluation  Patient Details  Name: Tina Sparks MRN: [id number] Date of Birth: [birth year] Referring Provider (PT): Dr. Stan Head   Encounter Date: 06/05/2021   PT End of Session - 06/05/21 1513     Visit Number 1    Date for PT Re-Evaluation 08/28/21    Authorization Type [employer]    PT Start Time 1030    PT Stop Time 1125    PT Time Calculation (min) 55 min    Activity Tolerance Patient tolerated treatment well    Behavior During Therapy [facility] for tasks assessed/performed             Past Medical History:  Diagnosis Date   Cystocele with rectocele    Meibomian gland dysfunction    Osteoarthritis    Osteoporosis    Perineocele    Vaginal stenosis 2015   After posterior repair corrected with surgery    Past Surgical History:  Procedure Laterality Date   COLONOSCOPY     ESOPHAGOGASTRODUODENOSCOPY  2021   ESOPHAGOGASTRODUODENOSCOPY (EGD) WITH PROPOFOL N/A 05/10/2021   Procedure: ESOPHAGOGASTRODUODENOSCOPY (EGD) WITH PROPOFOL;  Surgeon: Rachael Fee, MD;  Location: [facility];  Service: Endoscopy;  Laterality: N/A;   EUS N/A 05/10/2021   Procedure: UPPER ENDOSCOPIC ULTRASOUND (EUS) RADIAL;  Surgeon: Rachael Fee, MD;  Location: [facility];  Service: Endoscopy;  Laterality: N/A;   POSTERIOR REPAIR  2014   With sacrouterine fixation   VAGINAL HYSTERECTOMY  1985   Vaginal scar revision  2015   Scarring from prior prolapse surgery    There were no vitals filed for this visit.    Subjective Assessment - 06/05/21 1042     Subjective Patient reports over a year ago she had issues with emergency trips to have bowel movement that was very watery. It has been debilitattion for her. It is better now and being careful with what she eats. She is not a big eater. She has to plan  everything around a bathroom. Gets bubble feelings prior to bathroom.    Patient Stated Goals reduce fecal urgency and leakage    Currently in Pain? No/denies                [facility] PT Assessment - 06/05/21 0001       Assessment   Medical Diagnosis R15.2 Fecal urgency    Referring Provider (PT) Dr. Stan Head    Onset Date/Surgical Date --   1 year ago   Prior Therapy none      Precautions   Precautions Other (comment)    Precaution Comments osteoporosis      Restrictions   Weight Bearing Restrictions No      Balance Screen   Has the patient fallen in the past 6 months No    Has the patient had a decrease in activity level because of a fear of falling?  No    Is the patient reluctant to leave their home because of a fear of falling?  No      Home Tourist information centre manager residence      Prior Function   Level of Independence Independent    Vocation Part time employment    Buyer, retail    Leisure exercise      Cognition   Overall Cognitive Status Within Functional Limits for tasks assessed  Posture/Postural Control   Posture/Postural Control Postural limitations    Posture Comments sits on right buttocks      ROM / Strength   AROM / PROM / Strength AROM;PROM;Strength      Strength   Right Hip Extension 4/5    Right Hip ABduction 3+/5    Right Hip ADduction 4/5    Left Hip Extension 4/5    Left Hip ABduction 4/5    Left Hip ADduction 4/5                        Objective measurements completed on examination: See above findings.     Pelvic Floor Special Questions - 06/05/21 0001     Prior Pregnancies Yes    Number of Pregnancies 1    Number of Vaginal Deliveries 1    Urinary Leakage No    Fecal incontinence Yes   Type 3 and 4 2 x/wk; Type 7 2-3x/wk   Skin Integrity Intact;Other    Skin Integrity other on the perineal body there is a white cyst/pimple that is protruding out with a white head     Pelvic Floor Internal Exam Patient confimrs identification and approves PT to assess pelvic floor and treatment    Exam Type Rectal    Palpation at first she was not able to pull the puborectalis forward and the IAS was 0/5. After tactile cues and having her feel the coccyx move the muscle strength increased to 2/5 and she was able to islolate the muscles    Strength weak squeeze, no lift    Strength # of seconds --   30% seconds                     PT Education - 06/05/21 1131     Education Details Access Code: [email or web address]    Person(s) Educated Patient    Methods Explanation;Demonstration;Handout    Comprehension Verbalized understanding;Returned demonstration              PT Short Term Goals - 06/05/21 1527       PT SHORT TERM GOAL #1   Title independent with initial pelvic floor strengthening    Time 4    Period Weeks    Status New    Target Date 07/03/21               PT Long Term Goals - 06/05/21 1528       PT LONG TERM GOAL #1   Title independent with advanced pelvic floor exercise to reduce fecal leakage and urgency    Time 12    Period Weeks    Status New    Target Date 08/28/21      PT LONG TERM GOAL #2   Title fecal leakage decreased >/= 75% due to increased pelvic floor strength to 3/5 holding for 10 seconds    Time 12    Period Weeks    Status New    Target Date 08/28/21      PT LONG TERM GOAL #3   Title fecal urgency decreased >/= 50% due to improve coordination and control of the pelvic floor muscles    Time 12    Period Weeks    Status New    Target Date 08/28/21                    Plan - 06/05/21 1514     Clinical Impression Statement Patient is a  80 year old female with fecal incontinence and urgency for the past year with sudden onset. She has a history of posterior repoair of cystocele, rectocele and perintocele at [facility] and surgery of vaginal stenosis. Ptient will leak stool when she has the urge and can get worse  with stress. Patient reports this has been debilitating due to not wanting to socialize as much or work as much. She will have a type 3 or 4 stool 2x/week and Type 7 2-3 times per week. Patient reports she has lost weight in the past year and has not changed her eating habits. Patient will bave a bubble feeling prior to going to the bathroom. IAS and puborectalis strength was 1/5 initially and improved to 2/5 after tactile cues for her to contract the lower abdominals and feel for the coccyx to move forward. EAS is 2/5. Patinet has a cyst/pimple ont the perineal body that is protruding andhas a white head. She has weakness in hips. Patient would benefit from skilled therapy to improve coordination and strength of the pelvic floor to reduce the urgency and fecal leakage.    Personal Factors and Comorbidities Age;Comorbidity 3+;Time since onset of injury/illness/exacerbation    Comorbidities Hysterectomy 1985; Posterior repair 2014; Vaginal stenosis surgery; osteoporosis    Examination-Activity Limitations Continence;Locomotion Level;Toileting    Examination-Participation Restrictions Community Activity    Stability/Clinical Decision Making Evolving/Moderate complexity    Clinical Decision Making Low    Rehab Potential Good    PT Frequency 1x / week    PT Duration 12 weeks    PT Treatment/Interventions ADLs/Self Care Home Management;Biofeedback;Therapeutic activities;Therapeutic exercise;Neuromuscular re-education;Patient/family education;Manual techniques    PT Next Visit Plan advance pelvic floor strength with hip strength; core strength; see patient in September due to the therapist schedule    PT Home Exercise Plan Access Code: [email or web address]    Consulted and Agree with Plan of Care Patient             Patient will benefit from skilled therapeutic intervention in order to improve the following deficits and impairments:  Decreased endurance, Decreased strength, Decreased activity tolerance, Decreased  coordination  Visit Diagnosis: Muscle weakness (generalized) - Plan: PT plan of care cert/re-cert  Other lack of coordination - Plan: PT plan of care cert/re-cert  Incontinence of feces, unspecified fecal incontinence type - Plan: PT plan of care cert/re-cert     Problem List Patient Active Problem List   Diagnosis Date Noted   Fecal urgency 02/09/2021   Abnormality of pancreatic duct 02/09/2021   Dilated bile duct 02/09/2021   Loss of weight 02/09/2021    Earlie Counts, PT 06/05/21 3:32 PM   [facility] [address] [place], [place], [address] Phone: [phone]   Fax:  [phone]  Name: Tina Sparks MRN: [id number] Date of Birth: 1941-08-27  PHYSICAL THERAPY DISCHARGE SUMMARY  Visits from Start of Care: 1  Current functional level related to goals / functional outcomes: See above.    Remaining deficits: See above. Patient has not returned since her initial visit.    Education / Equipment: HEP   Patient agrees to discharge. Patient goals were not met. Patient is being discharged due to not returning since the last visit. Thank you for the referral. Earlie Counts, PT 09/13/21 9:31 AM

## 2021-07-19 ENCOUNTER — Ambulatory Visit: Payer: Medicare PPO | Admitting: Physical Therapy

## 2021-07-26 ENCOUNTER — Encounter: Payer: Self-pay | Admitting: Physical Therapy

## 2021-08-28 ENCOUNTER — Encounter: Payer: Medicare PPO | Admitting: Physical Therapy

## 2022-04-22 ENCOUNTER — Encounter: Payer: Self-pay | Admitting: Nurse Practitioner

## 2022-04-22 ENCOUNTER — Ambulatory Visit (INDEPENDENT_AMBULATORY_CARE_PROVIDER_SITE_OTHER): Payer: Medicare PPO | Admitting: Nurse Practitioner

## 2022-04-22 VITALS — BP 112/58 | HR 73 | Ht 66.0 in | Wt 118.6 lb

## 2022-04-22 DIAGNOSIS — R195 Other fecal abnormalities: Secondary | ICD-10-CM

## 2022-04-22 DIAGNOSIS — R634 Abnormal weight loss: Secondary | ICD-10-CM | POA: Diagnosis not present

## 2022-04-22 NOTE — Patient Instructions (Addendum)
If you are age 81 or older, your body mass index should be between 23-30. Your Body mass index is 19.14 kg/m. If this is out of the aforementioned range listed, please consider follow up with your Primary Care Provider. ________________________________________________________  The [facility] providers would like to encourage you to use [facility] to communicate with providers for non-urgent requests or questions.  Due to long hold times on the telephone, sending your provider a message by [facility] may be a faster and more efficient way to get a response.  Please allow 48 business hours for a response.  Please remember that this is for non-urgent requests.  _______________________________________________________   Trial of imodium 2 mg 30 minutes before eating, up to twice a day. This may prevent fecal urgency related to eating.     It is okay to take the Dicyclominel 10 mg ( has at home) up to twice daily even though not having abdominal pain is can sometimes slow diarrhea Continue keeping food diary recommended by Dietician. Follow up with Dr. Leone Payor.   You have been scheduled to follow up with Dr. Leone Payor on June 06, 2022 at 10:30 am  Thank you for entrusting me with your care and choosing [facility].  Willette Cluster, NP-C

## 2022-04-22 NOTE — Progress Notes (Signed)
Patient Profile:  Tina Sparks is a 81 y.o. female with a past medical history of osteoporosis.  Additional medical history as listed in Tina Sparks .  Assessment &  Plan   # Recurrent,  intermittent,  urgent loose stool over last several months.  Up to three BMs a day.  Non-toxic appearing. Abdominal exam reassuring.. She cannot relate the change in bowels to diet or medications. We saw her for same symptoms in April 2022. Fecal elastase and celiac serologies were negative.  Sent to pelvic floor PT with partial improvement in symptoms. Now with a recurrence of symptoms. She isn't convinced it was PT that helped her before. She wonders if stress and /or diet contributing. Query IBS? Microscopic colitis?  IBS?  Trial of imodium 2 mg 30 minutes before eating, up to twice a day. This may help firm up stool and help with fecal urgency      It is okay to take the Dicyclomine 10 mg ( has at home) up to twice daily even though not having abdominal pain is can sometimes slow motility Continue keeping food diary recommended by Dietician. She is considering seeing a counselor or talking to PCP about stress Follow up with Dr. Carlean Purl in August. If fails to improve / continues to lose weight then will need further workup   # Weight loss, unintentional.  Probably multifactorial ( fear of eating due to fecal urgency, stress, memory issues). She reports a 20 pound weight loss over last several months. We haven't seen her in nearly a year but by Epic her weight is down only 1.5 pounds since July 2022    HPI   Chief Complaint : loose stool  Tina Sparks is known to Dr. Carlean Purl, last seen in April 2022 for diarrhera.  His rectal exam didn't suggest pelvic floor dysfunction but she was ultimately referred to pelvic floor PT.   She has also been followed here for abnormal imaging of the bile duct/pancreatic duct . EUS showed no significant abnormalities   Interval History:  Tina Sparks is here with her niece Tina Sparks. It has  been slightly over a year since we saw her. She went to pelvic floor PT, things improved so she didn't follow up. However, several months ago she began having problems again. She admits to memory problems so it is a little hard to put together the details.  Stools are urgent,  liquid brown ( not watery nor oily).  She may have a ~ 3 liquid BMs in one day but only about 3 times a week. She has had some stool accidents not making it to the bathroom in time and this is extremely stressful for her. The remainder of the days of the week she has no BMs. Tina Sparks tells me that patient is scared to eat out for fear of having an accident. She may go a whole day without eating .   Since her symptoms restarted she hasn't tried any of the exercises previously recommended by PT. After further discussion she doesn't is not certain that PT helped the fecal urgency / loose stool. She wonders if the Bentyl helped and that is why symptoms came back after she stopped taking it. Other than stopping the Bentyl she cannot think of anything else that she changed to cause a relapse in symptoms. She has seen a nutritionist who recommends keeping a food diary. Patient hasn't yet followed up with the Dietician but hasn't noticed any relationship between her symptoms and particular foods. She does wonder  if stress if contributing to the loose stool. She lives alone. 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     Tina Sparks is worried. She wants to make sure nothing else is going on. She reports a 20 pound weight loss over the last several months. She is down in [place] only about 1.5 pounds i over the last year.    Previous GI Evaluation   Colonoscopy in [place] Sept 2021 for abnormal CT scan showing thickening of the cecal wall, a personal history of sessile adenoma of the colon --findings included mild nonbleeding diverticula in the left side  of the colon, otherwise normal    Labs:     Latest Ref Rng & Units 11/15/2020    2:34 PM  CBC  WBC 4.0 - 10.5 K/uL 7.7   Hemoglobin 12.0 - 15.0 g/dL 13.7   Hematocrit 36.0 - 46.0 % 40.0   Platelets 150.0 - 400.0 K/uL 371.0        Latest Ref Rng & Units 11/15/2020    2:34 PM  Hepatic Function  Total Protein 6.0 - 8.3 g/dL 7.1   Albumin 3.5 - 5.2 g/dL 4.6   AST 0 - 37 U/L 35   ALT 0 - 35 U/L 18   Alk Phosphatase 39 - 117 U/L 61   Total Bilirubin 0.2 - 1.2 mg/dL 0.6      Past Medical History:  Diagnosis Date   Cystocele with rectocele    Meibomian gland dysfunction    Osteoarthritis    Osteoporosis    Perineocele    Vaginal stenosis 2015   After posterior repair corrected with surgery    Past Surgical History:  Procedure Laterality Date   COLONOSCOPY     ESOPHAGOGASTRODUODENOSCOPY  2021   ESOPHAGOGASTRODUODENOSCOPY (EGD) WITH PROPOFOL N/A 05/10/2021   Procedure: ESOPHAGOGASTRODUODENOSCOPY (EGD) WITH PROPOFOL;  Surgeon: Milus Banister, MD;  Location: [facility];  Service: Endoscopy;  Laterality: N/A;   EUS N/A 05/10/2021   Procedure: UPPER ENDOSCOPIC ULTRASOUND (EUS) RADIAL;  Surgeon: Milus Banister, MD;  Location: [facility];  Service: Endoscopy;  Laterality: N/A;   POSTERIOR REPAIR  2014   With sacrouterine fixation   VAGINAL HYSTERECTOMY  1985   Vaginal scar revision  2015   Scarring from prior prolapse surgery    Current Medications, Allergies, Family History and Social History were reviewed in [employer] record.     Current Outpatient Medications  Medication Sig Dispense Refill   acetaminophen (TYLENOL) 500 MG tablet Take 500 mg by mouth at bedtime.     Biotin w/ Vitamins C & E (HAIR/SKIN/NAILS PO) Take 1 tablet by mouth daily.     CALCIUM-MAGNESIUM-ZINC PO Take 1 tablet by mouth daily.     celecoxib (CELEBREX) 200 MG capsule Take 200 mg by mouth daily.     Multiple Vitamin (MULTI-VITAMIN) tablet Take 1 tablet by mouth daily.  Centrum for woman     No current facility-administered medications for this visit.    Review of Systems: No chest pain. No shortness of breath. No urinary complaints.    Physical Exam  Wt Readings from Last 3 Encounters:  05/10/21 120 lb (54.4 kg)  02/14/21 122 lb (55.3 kg)  11/15/20 124 lb (56.2 kg)    BP (!) 112/58   Pulse 73   Ht $R'5\' 6"'iG$  (1.676 m)   Wt 118 lb 9.6 oz (53.8 kg)  BMI 19.14 kg/m  Constitutional:  Generally well appearing female in no acute distress. Psychiatric: Pleasant. Normal mood and affect. Behavior is normal. EENT: Pupils normal.  Conjunctivae are normal. No scleral icterus. Neck supple.  Cardiovascular: Normal rate, regular rhythm. No edema Pulmonary/chest: Effort normal and breath sounds normal. No wheezing, rales or rhonchi. Abdominal: Soft, nondistended, nontender. Bowel sounds active throughout. There are no masses palpable. No hepatomegaly. Neurological: Alert and oriented to person place and time. Skin: Skin is warm and dry. No rashes noted.  I spent 30 minutes total reviewing records, obtaining history, performing exam, counseling patient and documenting visit / findings.   Tye Savoy, NP  04/22/2022, 9:40 AM

## 2022-06-06 ENCOUNTER — Ambulatory Visit: Payer: Medicare PPO | Admitting: Internal Medicine

## 2022-07-16 ENCOUNTER — Ambulatory Visit: Payer: Medicare PPO | Admitting: Internal Medicine

## 2022-09-22 IMAGING — MR MR ABDOMEN WO/W CM MRCP
18 of 21 series · 44 of 48 positions shown · IV contrast (5ml GADAVIST)
Comparison: None.
COMPARISON: None.

Addendum:
CLINICAL DATA: Evaluate dilatation of the common bile duct and
pancreatic duct.



[Series 2: DWI · axial · 6.0mm · 1.49mm/px · z∈[-202,+50]mm · 4 of 72 slices shown (1 of 2)]
[im 1/72]
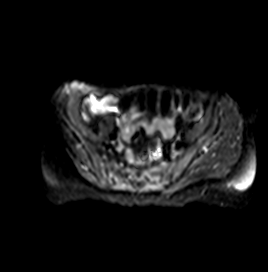
[im 24/72]
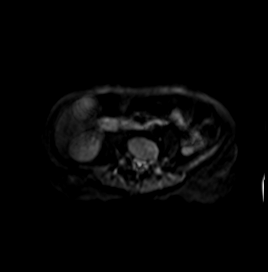
[im 48/72]
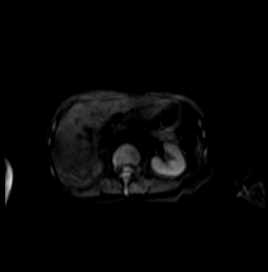
[im 72/72]
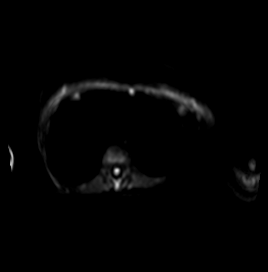

[Series 3: DWI · axial · 6.0mm · 1.49mm/px · 1 of 36 slices shown (2 of 2)]
[im 1/36]
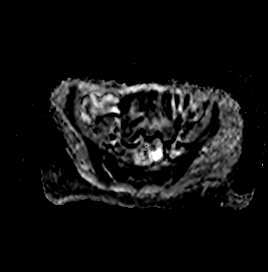

[Series 5: T2 fat-sat · axial · 6.0mm · 1.14mm/px · 1 of 36 slices shown]
[im 1/36]
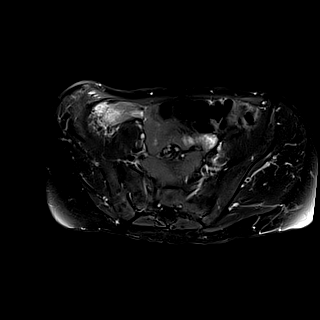

[Series 9: cor obl thk · sagittal · 50.0mm · 0.78mm/px · 1 of 8 slices shown]
[im 1/8]
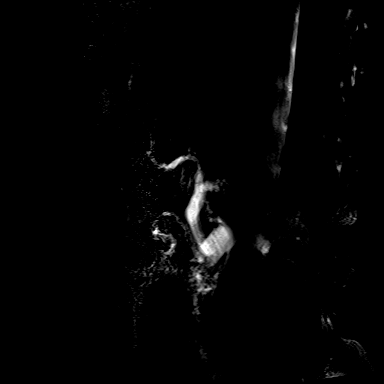

[Series 11: T2 · coronal · 6.0mm · 1.48mm/px · 1 of 21 slices shown (1 of 2)]
[im 1/21]
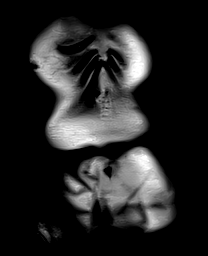

[Series 12: T1 · axial · 3.0mm · 1.25mm/px · z∈[-166,+47]mm · 3 of 72 slices shown (1 of 2)]
[im 1/72]
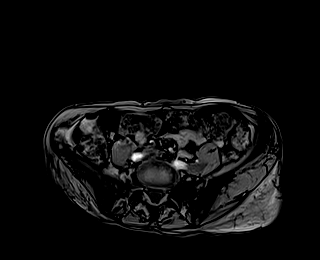
[im 36/72]
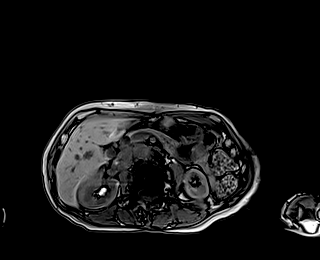
[im 72/72]
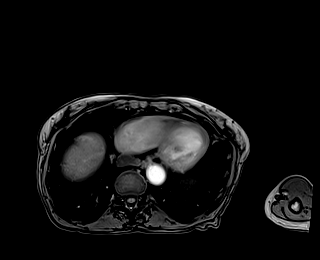

[Series 13: T1 · axial · 3.0mm · 1.25mm/px · z∈[-166,+47]mm · 3 of 72 slices shown (2 of 2)]
[im 1/72]
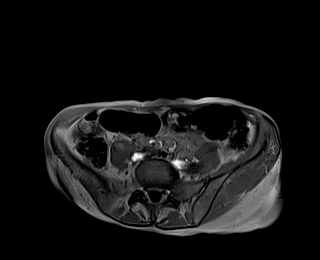
[im 36/72]
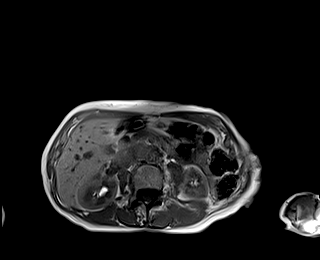
[im 72/72]
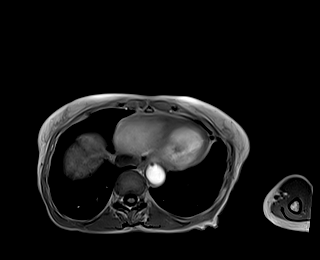

[Series 14: T2 · axial · 6.0mm · 1.56mm/px · 1 of 30 slices shown (2 of 2)]
[im 1/30]
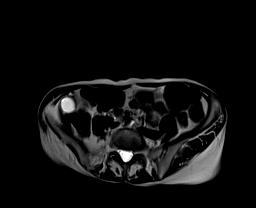

[Series 16: T1 dynamic · axial · 3.0mm · 1.25mm/px · z∈[-178,+59]mm · 3 of 80 slices shown (1 of 10)]
[im 1/80]
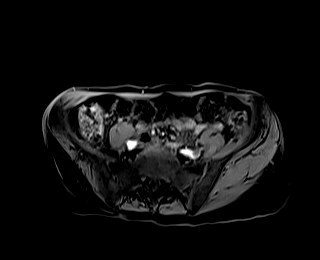
[im 40/80]
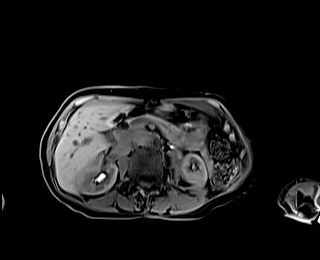
[im 80/80]
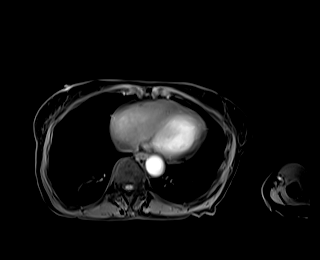

[Series 20: T1 dynamic · axial · 3.0mm · 1.25mm/px · z∈[-178,+59]mm · 3 of 80 slices shown (2 of 10)]
[im 1/80]
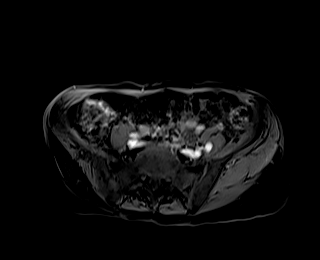
[im 40/80]
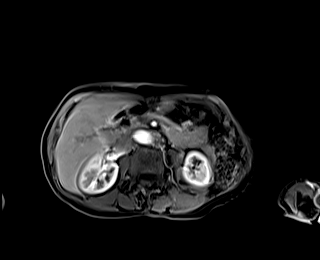
[im 80/80]
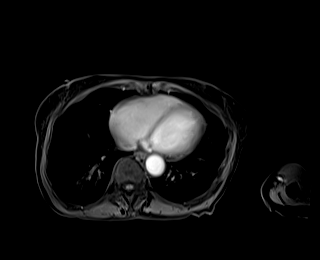

[Series 21: T1 dynamic · axial · 3.0mm · 1.25mm/px · z∈[-178,+59]mm · 3 of 80 slices shown (3 of 10)]
[im 1/80]
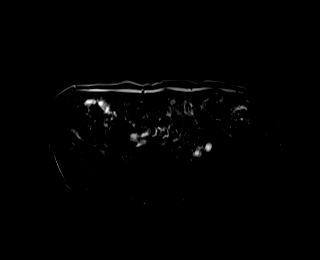
[im 40/80]
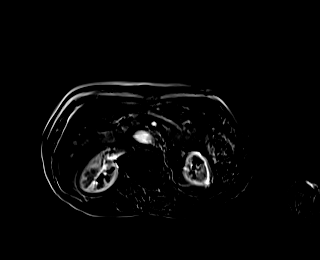
[im 80/80]
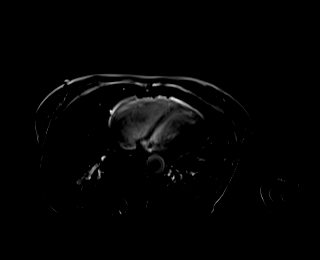

[Series 24: T1 dynamic · axial · 3.0mm · 1.25mm/px · z∈[-178,+59]mm · 3 of 80 slices shown (4 of 10)]
[im 1/80]
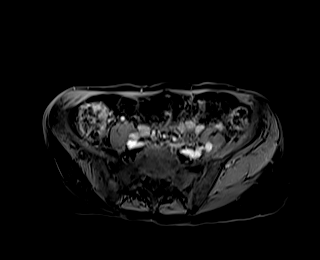
[im 40/80]
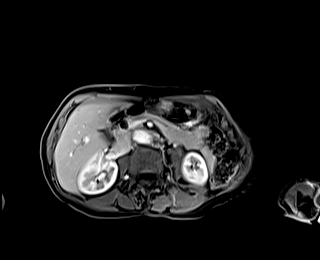
[im 80/80]
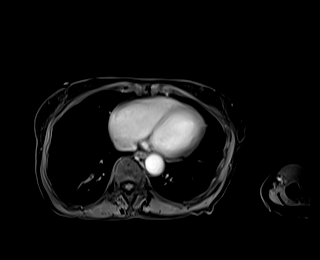

[Series 25: T1 dynamic · axial · 3.0mm · 1.25mm/px · z∈[-178,+59]mm · 3 of 80 slices shown (5 of 10)]
[im 1/80]
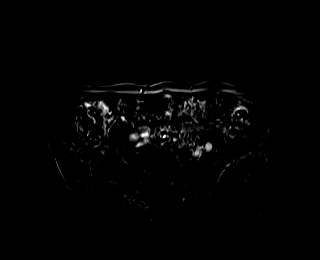
[im 40/80]
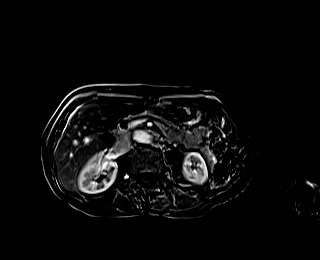
[im 80/80]
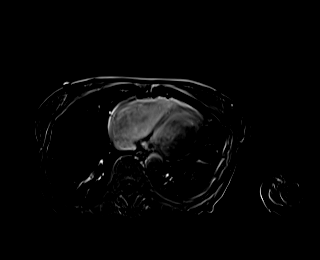

[Series 28: T1 dynamic · axial · 3.0mm · 1.25mm/px · z∈[-178,+59]mm · 3 of 80 slices shown (6 of 10)]
[im 1/80]
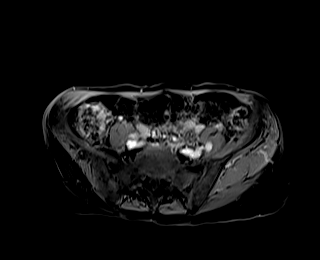
[im 40/80]
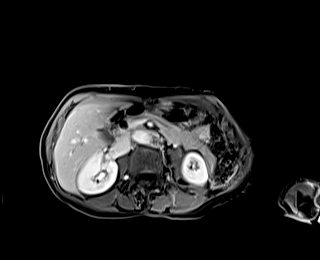
[im 80/80]
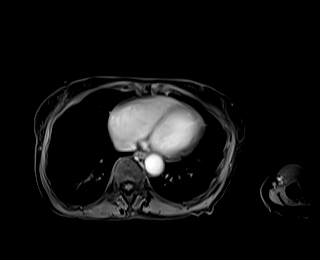

[Series 29: T1 dynamic · axial · 3.0mm · 1.25mm/px · z∈[-178,+59]mm · 3 of 80 slices shown (7 of 10)]
[im 1/80]
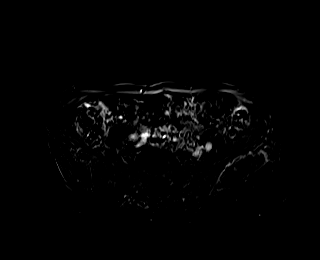
[im 40/80]
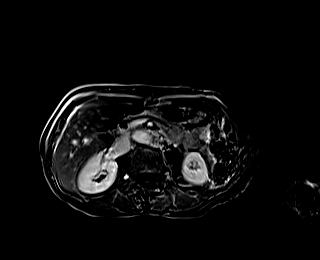
[im 80/80]
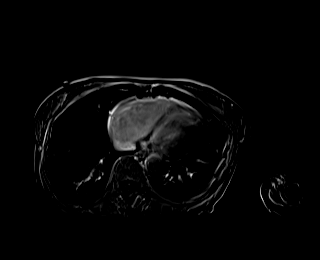

[Series 31: T1 dynamic · coronal · 3.0mm · 1.41mm/px · 2 of 52 slices shown (8 of 10)]
[im 1/52]
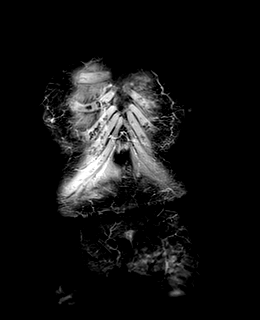
[im 52/52]
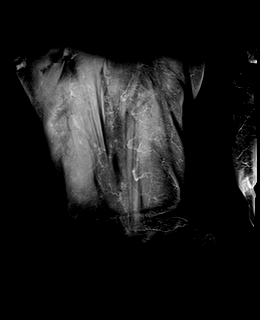

[Series 34: T1 dynamic · axial · 3.0mm · 1.25mm/px · z∈[-178,+59]mm · 3 of 80 slices shown (9 of 10)]
[im 1/80]
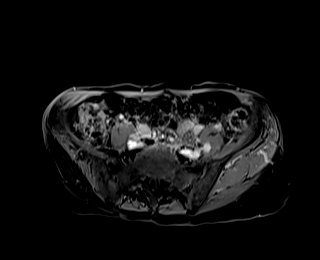
[im 40/80]
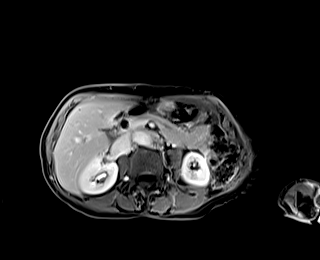
[im 80/80]
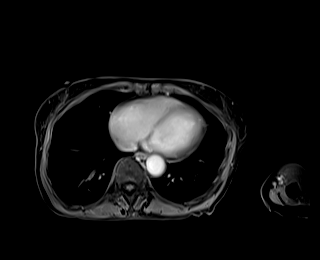

[Series 35: T1 dynamic · axial · 3.0mm · 1.25mm/px · z∈[-178,+59]mm · 3 of 80 slices shown (10 of 10)]
[im 1/80]
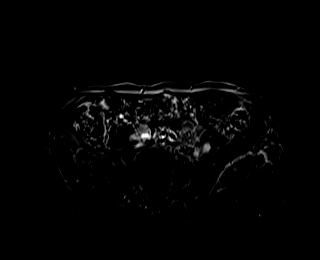
[im 40/80]
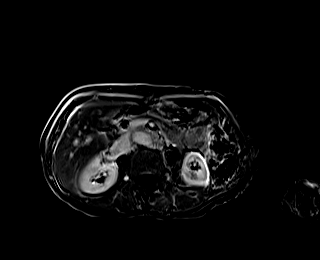
[im 80/80]
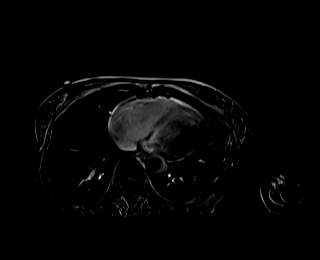

[44 of 48 positions shown; findings below may reference images not displayed]

FINDINGS: Lower chest: Choose 1

Hepatobiliary: No suspicious liver lesions. Tiny cysts versus
biliary hamartoma within the posteroinferior right hepatic lobe
measuring 4 mm, image 47/24. The gallbladder appears within normal
limits. No gallstones or signs of gallbladder wall inflammation.
Fusiform dilatation of the common bile duct measures up to 1 cm,
image 37/7. No signs of choledocholithiasis. Mild central
intrahepatic biliary dilatation noted.

Pancreas: No pancreatic inflammation. Within the head of pancreas
there is mild increase caliber of the main pancreatic duct which
measures between 3 and 4 mm. Normal caliber main duct within the
neck, body and tail. Focal area of nonenhancing signal void artifact
noted within the head of pancreas in the region of the distal main
duct measuring 0.9 cm, image 47/13. Postcontrast images are motion
limited. Within this limitation there are no enhancing pancreatic
lesions.

Spleen:  Within normal limits in size and appearance.

Adrenals/Urinary Tract: Normal appearance of the adrenal glands. No
hydronephrosis identified bilaterally. Small, less than 1 cm T2
hyperintense foci are noted in the right kidney, which are far too
small to characterize. No hydronephrosis identified bilaterally.

Stomach/Bowel: Visualized portions within the abdomen are
unremarkable.

Vascular/Lymphatic: No pathologically enlarged lymph nodes
identified. No abdominal aortic aneurysm demonstrated.

Other:  None.

Musculoskeletal: Lumbar scoliosis and degenerative disc disease
noted.
IMPRESSION: 1. Fusiform dilatation of the common bile duct measuring up to 1 cm
without evidence for choledocholithiasis.
2. There is mild increase caliber of the main pancreatic duct within
the head of pancreas which measures between 3 and 4 mm. Focal area
of signal void artifact noted within the head of pancreas in the
region of the distal main duct measuring 0.9 cm, image 47/13. This
may represent a calcification. Comparison with previous outside CT
would be helpful for further delineation of the etiology for signal
void artifact. Additionally, if there are signs or symptoms of
biliary obstruction further investigation with ERCP would be
recommended to exclude underlying lesion at the ampulla or occult
lesion within head of pancreas.

ADDENDUM:
The following is a comparison with the CT of the abdomen pelvis from
06/14/2020.

-The signal void artifact within the head of pancreas in the region
of the distal main duct noted on current MRI corresponds to a small
duodenal diverticulum containing gas, image 247 of series 2 on the
previous exam.

-Similar appearance of dilated common bile duct measuring 1 cm.
Additionally, the main pancreatic duct had a similar increase
caliber between 3 and 4 mm on the exam from 06/14/2020.

*** End of Addendum ***
FINDINGS: Lower chest: Choose 1

Hepatobiliary: No suspicious liver lesions. Tiny cysts versus
biliary hamartoma within the posteroinferior right hepatic lobe
measuring 4 mm, image 47/24. The gallbladder appears within normal
limits. No gallstones or signs of gallbladder wall inflammation.
Fusiform dilatation of the common bile duct measures up to 1 cm,
image 37/7. No signs of choledocholithiasis. Mild central
intrahepatic biliary dilatation noted.

Pancreas: No pancreatic inflammation. Within the head of pancreas
there is mild increase caliber of the main pancreatic duct which
measures between 3 and 4 mm. Normal caliber main duct within the
neck, body and tail. Focal area of nonenhancing signal void artifact
noted within the head of pancreas in the region of the distal main
duct measuring 0.9 cm, image 47/13. Postcontrast images are motion
limited. Within this limitation there are no enhancing pancreatic
lesions.

Spleen:  Within normal limits in size and appearance.

Adrenals/Urinary Tract: Normal appearance of the adrenal glands. No
hydronephrosis identified bilaterally. Small, less than 1 cm T2
hyperintense foci are noted in the right kidney, which are far too
small to characterize. No hydronephrosis identified bilaterally.

Stomach/Bowel: Visualized portions within the abdomen are
unremarkable.

Vascular/Lymphatic: No pathologically enlarged lymph nodes
identified. No abdominal aortic aneurysm demonstrated.

Other:  None.

Musculoskeletal: Lumbar scoliosis and degenerative disc disease
noted.
IMPRESSION: 1. Fusiform dilatation of the common bile duct measuring up to 1 cm
without evidence for choledocholithiasis.
2. There is mild increase caliber of the main pancreatic duct within
the head of pancreas which measures between 3 and 4 mm. Focal area
of signal void artifact noted within the head of pancreas in the
region of the distal main duct measuring 0.9 cm, image 47/13. This
may represent a calcification. Comparison with previous outside CT
would be helpful for further delineation of the etiology for signal
void artifact. Additionally, if there are signs or symptoms of
biliary obstruction further investigation with ERCP would be
recommended to exclude underlying lesion at the ampulla or occult
lesion within head of pancreas.
